# Patient Record
Sex: Female | Born: 1980 | Race: White | Hispanic: No | Marital: Single | Smoking: Current every day smoker
Health system: Southern US, Community
[De-identification: ages and names within clinical notes are randomized; demographics above are authoritative.]

---

## 2009-07-31 ENCOUNTER — Ambulatory Visit: Payer: Self-pay | Admitting: Physician Assistant

## 2009-07-31 ENCOUNTER — Inpatient Hospital Stay (HOSPITAL_COMMUNITY): Admission: AD | Admit: 2009-07-31 | Discharge: 2009-07-31 | Payer: Self-pay | Admitting: Obstetrics and Gynecology

## 2009-09-02 ENCOUNTER — Ambulatory Visit: Payer: Self-pay | Admitting: Advanced Practice Midwife

## 2009-09-02 ENCOUNTER — Inpatient Hospital Stay (HOSPITAL_COMMUNITY): Admission: AD | Admit: 2009-09-02 | Discharge: 2009-09-02 | Payer: Self-pay | Admitting: Obstetrics & Gynecology

## 2009-09-03 ENCOUNTER — Ambulatory Visit: Payer: Self-pay | Admitting: Advanced Practice Midwife

## 2009-09-03 ENCOUNTER — Inpatient Hospital Stay (HOSPITAL_COMMUNITY): Admission: AD | Admit: 2009-09-03 | Discharge: 2009-09-05 | Payer: Self-pay | Admitting: Obstetrics & Gynecology

## 2009-09-25 ENCOUNTER — Ambulatory Visit: Payer: Self-pay | Admitting: Obstetrics & Gynecology

## 2010-04-30 ENCOUNTER — Emergency Department (HOSPITAL_COMMUNITY)
Admission: EM | Admit: 2010-04-30 | Discharge: 2010-05-01 | Payer: Self-pay | Source: Home / Self Care | Admitting: Emergency Medicine

## 2010-10-17 HISTORY — PX: NECK SURGERY: SHX720

## 2011-01-01 LAB — POCT I-STAT, CHEM 8
Chloride: 108 mEq/L (ref 96–112)
Glucose, Bld: 88 mg/dL (ref 70–99)
HCT: 40 % (ref 36.0–46.0)
Hemoglobin: 13.6 g/dL (ref 12.0–15.0)
Sodium: 143 mEq/L (ref 135–145)

## 2011-01-01 LAB — DIFFERENTIAL
Eosinophils Relative: 1 % (ref 0–5)
Lymphs Abs: 1.3 10*3/uL (ref 0.7–4.0)
Monocytes Relative: 7 % (ref 3–12)

## 2011-01-01 LAB — CBC
HCT: 38.3 % (ref 36.0–46.0)
Hemoglobin: 13 g/dL (ref 12.0–15.0)
MCH: 30.3 pg (ref 26.0–34.0)
RBC: 4.29 MIL/uL (ref 3.87–5.11)
WBC: 5.4 10*3/uL (ref 4.0–10.5)

## 2011-01-18 LAB — POCT PREGNANCY, URINE: Preg Test, Ur: NEGATIVE

## 2011-01-19 LAB — CBC
HCT: 39.4 % (ref 36.0–46.0)
Hemoglobin: 13.3 g/dL (ref 12.0–15.0)
MCV: 90.2 fL (ref 78.0–100.0)
RBC: 4.36 MIL/uL (ref 3.87–5.11)

## 2011-01-19 LAB — URINALYSIS, ROUTINE W REFLEX MICROSCOPIC
Bilirubin Urine: NEGATIVE
Nitrite: NEGATIVE
Protein, ur: NEGATIVE mg/dL
Urobilinogen, UA: 0.2 mg/dL (ref 0.0–1.0)

## 2011-01-19 LAB — URINE CULTURE

## 2011-01-19 LAB — RPR: RPR Ser Ql: NONREACTIVE

## 2011-01-19 LAB — URINE MICROSCOPIC-ADD ON

## 2011-01-19 LAB — CULTURE, BETA STREP (GROUP B ONLY)

## 2011-01-19 LAB — RAPID URINE DRUG SCREEN, HOSP PERFORMED
Barbiturates: NOT DETECTED
Tetrahydrocannabinol: NOT DETECTED

## 2012-04-07 IMAGING — CT CT NECK W/ CM
2 of 4 series · 4 of 14 positions shown, 5 images · IV contrast (omnipaque)
Comparison: None.

CLINICAL DATA: Known right anterior neck cyst for 2 years; cyst
becoming larger and more painful, with difficulty breathing and
difficulty turning the neck to the left side.

CT NECK WITH CONTRAST
TECHNIQUE: Multidetector CT imaging of the neck was performed with
intravenous contrast.
Contrast: 75 mL of Omnipaque 300 IV contrast

[Series 2: 2cc/30ml and 1cc/45ml · axial · 0.47mm/px · z∈[-190,-112]mm · 2 of 94 slices shown]
[im 32/94  bone]
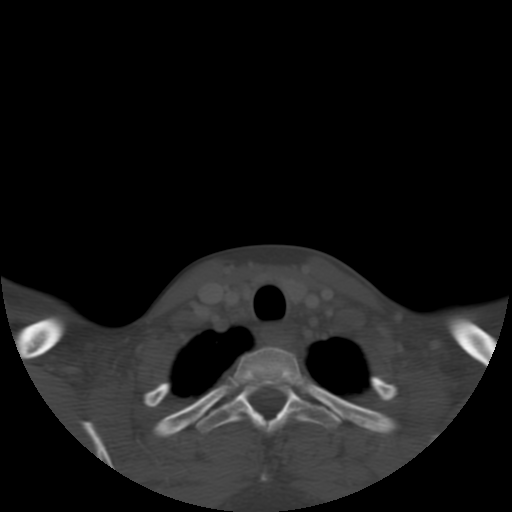
[im 63/94  bone]
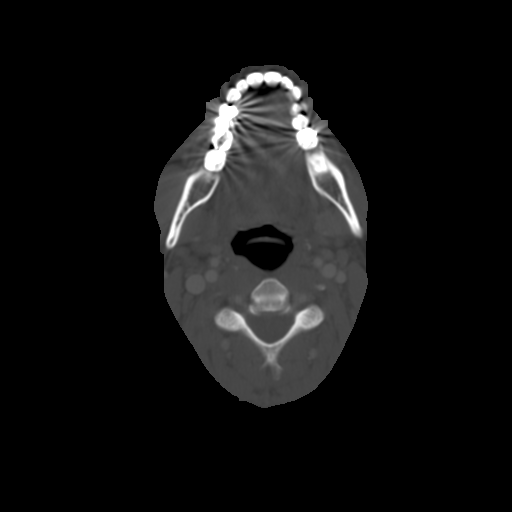

[Series 302: reformatted · axial · 0.47mm/px · z∈[-221,-148]mm · 2 of 118 slices shown, 3 images]
[im 40/118  soft-tissue]
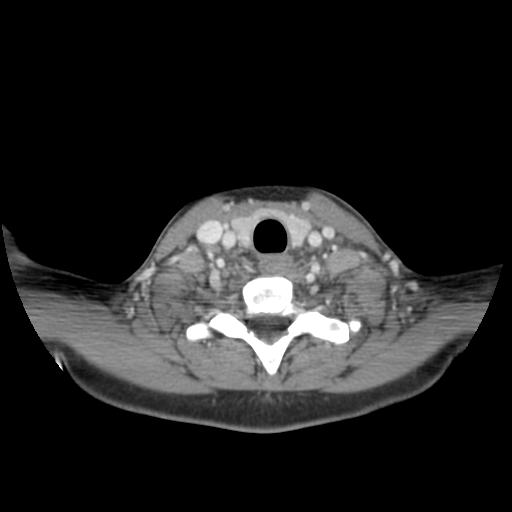
[im 40/118  bone]
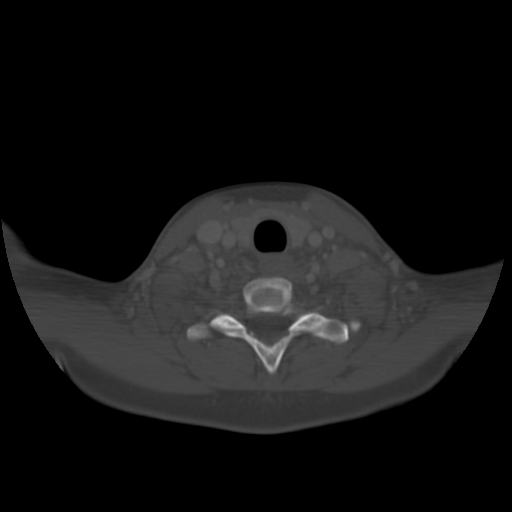
[im 79/118  bone]
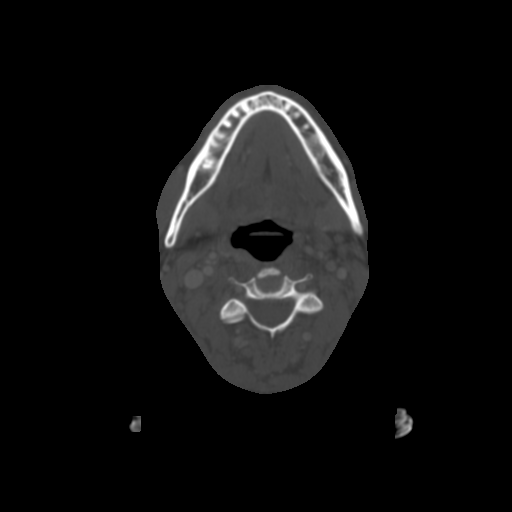

[4 of 14 positions shown; findings below may reference images not displayed]

FINDINGS: There is a large 2.9 x 2.7 x 2.6 cm cyst with mild
peripheral enhancement arising at the superior aspect of the right
strap muscles, likely primarily within the superior belly of the
omohyoid, extending from the level of the hyoid bone to the
superior edge of the thyroid gland; it remains anterior to the
thyroid gland.

This displaces adjacent small vessels, and given its location
within the right-sided strap muscles most likely reflects a
thyroglossal duct cyst.  Its location would be atypical for a 2nd
branchial cleft cyst.  Its appearance is too simple for a low
density lymph node; metastatic lesions typically do not have this
appearance.

There is no definite evidence of abscess formation.  No significant
associated soft tissue stranding is noted.

No significant cervical lymphadenopathy is seen.  No significant
vascular compromise is appreciated.  Aside from a small displaced
vessels, the visualized vasculature is unremarkable in appearance.

The parotid and submandibular glands are intact.  The
parapharyngeal fat planes are preserved.  The thyroid gland is
unremarkable in appearance.

The nasopharynx, oropharynx and hypopharynx are within normal
limits.  There is no evidence of impression on the hypopharynx,
vocal cords or proximal trachea; the cyst remains superficial to
these structures.  The vocal cords are symmetric in appearance,
without evidence of associated abnormality.

The great vessels are unremarkable in appearance.  The visualized
portions of the superior mediastinum are within normal limits.  The
visualized lung apices are clear.

The orbits are unremarkable in appearance.  The visualized
paranasal sinuses and mastoid air cells are well-aerated.  The
visualized portions of the brain are unremarkable.
IMPRESSION: 1.  2.9 x 2.7 x 2.6 cm cyst with mild peripheral wall enhancement
arising at the superior aspect of the right strap muscles, likely
primarily within the superior belly of the hyoid, extending from
the level of the hyoid bone to the superior edge of the thyroid
gland; it remains anterior to the thyroid gland. Given its location
within the right strap muscles, this most likely reflects a
thyroglossal duct cyst.
2. No evidence of abscess formation; no significant soft tissue
stranding noted.  No cervical lymphadenopathy is seen.
3.  No evidence of impression on the hypopharynx, vocal cords or
proximal trachea; the vocal cords are symmetric and normal in
appearance.

## 2015-12-12 ENCOUNTER — Emergency Department (HOSPITAL_COMMUNITY)
Admission: EM | Admit: 2015-12-12 | Discharge: 2015-12-12 | Disposition: A | Discharge status: Home / Self Care | Payer: Medicaid - Out of State | Attending: Emergency Medicine | Admitting: Emergency Medicine

## 2015-12-12 ENCOUNTER — Encounter (HOSPITAL_COMMUNITY): Payer: Self-pay | Admitting: Emergency Medicine

## 2015-12-12 DIAGNOSIS — F17200 Nicotine dependence, unspecified, uncomplicated: Secondary | ICD-10-CM | POA: Diagnosis not present

## 2015-12-12 DIAGNOSIS — S4991XA Unspecified injury of right shoulder and upper arm, initial encounter: Secondary | ICD-10-CM | POA: Diagnosis not present

## 2015-12-12 DIAGNOSIS — Z880 Allergy status to penicillin: Secondary | ICD-10-CM | POA: Insufficient documentation

## 2015-12-12 DIAGNOSIS — S0990XA Unspecified injury of head, initial encounter: Secondary | ICD-10-CM | POA: Insufficient documentation

## 2015-12-12 DIAGNOSIS — Y9289 Other specified places as the place of occurrence of the external cause: Secondary | ICD-10-CM | POA: Diagnosis not present

## 2015-12-12 DIAGNOSIS — S199XXA Unspecified injury of neck, initial encounter: Secondary | ICD-10-CM | POA: Diagnosis not present

## 2015-12-12 DIAGNOSIS — Y9389 Activity, other specified: Secondary | ICD-10-CM | POA: Insufficient documentation

## 2015-12-12 DIAGNOSIS — X58XXXA Exposure to other specified factors, initial encounter: Secondary | ICD-10-CM | POA: Diagnosis not present

## 2015-12-12 DIAGNOSIS — Y998 Other external cause status: Secondary | ICD-10-CM | POA: Insufficient documentation

## 2015-12-12 DIAGNOSIS — M542 Cervicalgia: Secondary | ICD-10-CM

## 2015-12-12 DIAGNOSIS — M25519 Pain in unspecified shoulder: Secondary | ICD-10-CM

## 2015-12-12 MED ORDER — KETOROLAC TROMETHAMINE 30 MG/ML IJ SOLN
30.0000 mg | Freq: Once | INTRAMUSCULAR | Status: AC
  Administered 2015-12-12: 30 mg via INTRAMUSCULAR
  Filled 2015-12-12: qty 1

## 2015-12-12 MED ORDER — DIAZEPAM 5 MG PO TABS
5.0000 mg | ORAL_TABLET | Freq: Once | ORAL | Status: AC
  Administered 2015-12-12: 5 mg via ORAL
  Filled 2015-12-12: qty 1

## 2015-12-12 MED ORDER — IBUPROFEN 600 MG PO TABS: 600.0000 mg | ORAL_TABLET | Freq: Four times a day (QID) | ORAL | Status: AC | PRN

## 2015-12-12 MED ORDER — METHOCARBAMOL 500 MG PO TABS: 500.0000 mg | ORAL_TABLET | Freq: Two times a day (BID) | ORAL | Status: AC

## 2015-12-12 NOTE — ED Notes (Signed)
Declined W/C at D/C and was escorted to lobby by RN. 

## 2015-12-12 NOTE — Discharge Instructions (Signed)
Take your medications as prescribed. Take your Robaxin at night as this medication can cause you to be very drowsy-do not drive while taking this medication. Follow-up with your doctor as needed. Return to ED for new or worsening symptoms as we discussed.  Musculoskeletal Pain Musculoskeletal pain is muscle and boney aches and pains. These pains can occur in any part of the body. Your caregiver may treat you without knowing the cause of the pain. They may treat you if blood or urine tests, X-rays, and other tests were normal.  CAUSES There is often not a definite cause or reason for these pains. These pains may be caused by a type of germ (virus). The discomfort may also come from overuse. Overuse includes working out too hard when your body is not fit. Boney aches also come from weather changes. Bone is sensitive to atmospheric pressure changes. HOME CARE INSTRUCTIONS   Ask when your test results will be ready. Make sure you get your test results.  Only take over-the-counter or prescription medicines for pain, discomfort, or fever as directed by your caregiver. If you were given medications for your condition, do not drive, operate machinery or power tools, or sign legal documents for 24 hours. Do not drink alcohol. Do not take sleeping pills or other medications that may interfere with treatment.  Continue all activities unless the activities cause more pain. When the pain lessens, slowly resume normal activities. Gradually increase the intensity and duration of the activities or exercise.  During periods of severe pain, bed rest may be helpful. Lay or sit in any position that is comfortable.  Putting ice on the injured area.  Put ice in a bag.  Place a towel between your skin and the bag.  Leave the ice on for 15 to 20 minutes, 3 to 4 times a day.  Follow up with your caregiver for continued problems and no reason can be found for the pain. If the pain becomes worse or does not go away, it  may be necessary to repeat tests or do additional testing. Your caregiver may need to look further for a possible cause. SEEK IMMEDIATE MEDICAL CARE IF:  You have pain that is getting worse and is not relieved by medications.  You develop chest pain that is associated with shortness or breath, sweating, feeling sick to your stomach (nauseous), or throw up (vomit).  Your pain becomes localized to the abdomen.  You develop any new symptoms that seem different or that concern you. MAKE SURE YOU:   Understand these instructions.  Will watch your condition.  Will get help right away if you are not doing well or get worse.   This information is not intended to replace advice given to you by your health care provider. Make sure you discuss any questions you have with your health care provider.   Document Released: 10/03/2005 Document Revised: 12/26/2011 Document Reviewed: 06/07/2013 Elsevier Interactive Patient Education [employer] [phone]

## 2015-12-12 NOTE — ED Provider Notes (Signed)
CSN: [id number]     Arrival date & time 12/12/15  1454 History  By signing my name below, I, Kathy Banks, attest that this documentation has been prepared under the direction and in the presence of Joycie Peek, PA-C Electronically Signed: Angelene Giovanni, ED Scribe. 12/12/2015. 4:32 PM.    Chief Complaint  Patient presents with  . Neck Pain   The history is provided by the patient. No language interpreter was used.   HPI Comments: Kathy Banks is a 35 y.o. female who presents to the Emergency Department complaining of gradually worsening constant moderate neck pain and stiffness s/p heavy lifting of furniture that occurred 2 days ago. She reports associated posterior occipital HA. She states that the pain is worse with movement where she feels the pain at the base of her skull. She states that she took Tylenol with no relief. 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 She denies any vision changes, fever, chills, weakness, or numbness. No other modifying factors.   History reviewed. No pertinent past medical history. Past Surgical History  Procedure Laterality Date  . Neck surgery  2012   No family history on file. Social History  Substance Use Topics  . Smoking status: Current Every Day Smoker  . Smokeless tobacco: None  . Alcohol Use: No   OB History    No data available     Review of Systems  Constitutional: Negative for fever and chills.  Eyes: Negative for visual disturbance.  Musculoskeletal: Positive for neck pain and neck stiffness.  Neurological: Positive for headaches. Negative for weakness and numbness.  All other systems reviewed and are negative.     Allergies  Aspirin and Penicillins  Home Medications   Prior to Admission medications   Medication Sig Start Date End Date Taking? Authorizing Provider  ibuprofen (ADVIL,MOTRIN) 600 MG tablet Take 1 tablet (600 mg total) by  mouth every 6 (six) hours as needed. 12/12/15   Joycie Peek, PA-C  methocarbamol (ROBAXIN) 500 MG tablet Take 1 tablet (500 mg total) by mouth 2 (two) times daily. 12/12/15   Joycie Peek, PA-C   BP 131/87 mmHg  Pulse 69  Temp(Src) 98.5 F (36.9 C) (Oral)  Resp 14  Ht  (1.676 m)  Wt 60.328 kg  BMI 21.48 kg/m2  SpO2 100%  LMP 12/05/2015 Physical Exam  Constitutional: She is oriented to person, place, and time. She appears well-developed and well-nourished. No distress.  HENT:  Head: Normocephalic and atraumatic.  Eyes: Conjunctivae and EOM are normal.  Neck: Neck supple. No tracheal deviation present.  No carotid bruits . Slightly decreased range of motion of cervical spine secondary only to pain. No other abnormalities noted.  Cardiovascular: Normal rate, regular rhythm and normal heart sounds.   Pulmonary/Chest: Effort normal. No respiratory distress.  Musculoskeletal: Normal range of motion. She exhibits tenderness.  Tender diffusely throughout right paraspinal cervical musculature and right trapezius  No midline spinous processes tenderness.  Full ROM of all extremities No meningismus   Neurological: She is alert and oriented to person, place, and time. No cranial nerve deficit. Coordination normal.  Motor strength and sensation are baseline. Gait is baseline  Skin: Skin is warm and dry.  Psychiatric: She has a normal mood and affect. Her behavior is normal.  Nursing note and vitals reviewed.   ED Course  Procedures (including critical care time) DIAGNOSTIC STUDIES: Oxygen Saturation is 98%  on RA, normal by my interpretation.    COORDINATION OF CARE: 4:28 PM- Pt advised of plan for treatment and pt agrees. Pt will receive 500 mg Diazepam and Toradol for relief. Advised to use Ibuprofen and warm compresses. Return precautions discussed. Pt will return if she develops fever or numbness.   MDM  [name] is a 35 y.o. female who presents for evaluation  of right-sided neck and shoulder discomfort after moving furniture. Symptoms and exam are consistent with musculoskeletal strain. Low suspicion for any spinal cord or vascular pathology. Treated in the emergency department with NSAIDs and muscle relaxer. Patient will ride home with husband. Encourage further symptomatic support at home, will DC with anti-inflammatories and short course muscle relaxer. Follow-up with PCP next week for reevaluation. Discussed strict return precautions. She verbalizes understanding and agrees with this plan as well as subsequent discharge. Final diagnoses:  Neck and shoulder pain    I personally performed the services described in this documentation, which was scribed in my presence. The recorded information has been reviewed and is accurate.   Joycie Peek, PA-C 12/12/15 1721  Melene Plan, DO 12/12/15 2317

## 2015-12-12 NOTE — ED Notes (Signed)
Pt c/o doing heavy lifting 2 days ago and cannot move her neck.
